# Patient Record
Sex: Male | Born: 1990 | Race: Black or African American | Hispanic: No | Marital: Single | Smoking: Current every day smoker
Health system: Southern US, Community
[De-identification: ages and names within clinical notes are randomized; demographics above are authoritative.]

---
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
## 2018-11-16 ENCOUNTER — Ambulatory Visit (HOSPITAL_COMMUNITY)
Admission: EM | Admit: 2018-11-16 | Discharge: 2018-11-16 | Disposition: A | Discharge status: Home / Self Care | Payer: 59 | Attending: Internal Medicine | Admitting: Internal Medicine

## 2018-11-16 ENCOUNTER — Encounter (HOSPITAL_COMMUNITY): Payer: Self-pay | Admitting: Emergency Medicine

## 2018-11-16 ENCOUNTER — Other Ambulatory Visit: Payer: Self-pay

## 2018-11-16 DIAGNOSIS — R030 Elevated blood-pressure reading, without diagnosis of hypertension: Secondary | ICD-10-CM

## 2018-11-16 DIAGNOSIS — R220 Localized swelling, mass and lump, head: Secondary | ICD-10-CM | POA: Diagnosis not present

## 2018-11-16 MED ORDER — AMLODIPINE BESYLATE 5 MG PO TABS: 5.0000 mg | ORAL_TABLET | Freq: Every day | ORAL | 1 refills | Status: DC

## 2018-11-16 MED ORDER — CLONIDINE HCL 0.1 MG PO TABS
0.1000 mg | ORAL_TABLET | Freq: Once | ORAL | Status: AC
  Administered 2018-11-16: 10:00:00 0.1 mg via ORAL

## 2018-11-16 MED ORDER — CLONIDINE HCL 0.1 MG PO TABS
ORAL_TABLET | ORAL | Status: AC
  Filled 2018-11-16: qty 1

## 2018-11-16 MED ORDER — PREDNISONE 10 MG PO TABS: 20.0000 mg | ORAL_TABLET | Freq: Every day | ORAL | 0 refills | Status: AC

## 2018-11-16 MED ORDER — DIPHENHYDRAMINE HCL 25 MG PO TABS: 25.0000 mg | ORAL_TABLET | Freq: Four times a day (QID) | ORAL | 0 refills | Status: AC | PRN

## 2018-11-16 NOTE — ED Triage Notes (Signed)
Pt presents to [facility] for assessment of swollen left lip since yesterday, states it got markedly more pronounced this morning.  Denies injury.

## 2018-11-18 NOTE — ED Provider Notes (Signed)
[facility]    CSN: [id number] Arrival date & time: 11/16/18  0935     History   Chief Complaint Chief Complaint  Patient presents with  . Oral Swelling    HPI Jeffrey Kirby is a 28 y.o. male no past medical history comes to urgent care with 1 day history of lower lip swelling.  Patient denies any trauma to the lip.  Swelling started insidiously and is gotten progressively worse.  He believes that he had a bug bite.  Lip is not painful.  No tongue swelling.  No redness over the lip.  No nausea or vomiting.  No difficulty breathing.   HPI  History reviewed. No pertinent past medical history.  There are no active problems to display for this patient.   History reviewed. No pertinent surgical history.     Home Medications    Prior to Admission medications   Medication Sig Start Date End Date Taking? Authorizing Provider  amLODipine (NORVASC) 5 MG tablet Take 1 tablet (5 mg total) by mouth daily. 11/16/18   Dreon Pineda, Britta MccreedyPhilip O, MD  diphenhydrAMINE (BENADRYL) 25 MG tablet Take 1 tablet (25 mg total) by mouth every 6 (six) hours as needed for up to 3 days for itching or allergies. 11/16/18 11/19/18  Merrilee JanskyLamptey, Cachet Mccutchen O, MD  predniSONE (DELTASONE) 10 MG tablet Take 2 tablets (20 mg total) by mouth daily for 3 days. 11/16/18 11/19/18  Merrilee JanskyLamptey, Daivon Rayos O, MD    Family History History reviewed. No pertinent family history.  Social History Social History   Tobacco Use  . Smoking status: Current Every Day Smoker    Types: Cigars  . Smokeless tobacco: Never Used  Substance Use Topics  . Alcohol use: Not Currently  . Drug use: Never     Allergies   Shrimp [shellfish allergy]   Review of Systems Review of Systems  HENT: Negative.  Negative for mouth sores, nosebleeds, postnasal drip, rhinorrhea and sore throat.   Respiratory: Negative.   Cardiovascular: Negative.   Gastrointestinal: Negative for abdominal distention, abdominal pain, diarrhea and nausea.   Genitourinary: Negative.   Musculoskeletal: Negative for arthralgias and gait problem.  Skin: Negative.   Neurological: Negative for dizziness, light-headedness and headaches.     Physical Exam Triage Vital Signs ED Triage Vitals  Enc Vitals Group     BP 11/16/18 1004 (!) 190/138     Pulse Rate 11/16/18 1004 74     Resp 11/16/18 1004 18     Temp 11/16/18 1004 98.3 F (36.8 C)     Temp Source 11/16/18 1004 Oral     SpO2 11/16/18 1004 99 %     Weight --      Height --      Head Circumference --      Peak Flow --      Pain Score 11/16/18 1008 5     Pain Loc --      Pain Edu? --      Excl. in GC? --    No data found.  Updated Vital Signs BP (!) 181/130 (BP Location: Right Arm)   Pulse 74   Temp 98.3 F (36.8 C) (Oral)   Resp 18   SpO2 99%   Visual Acuity Right Eye Distance:   Left Eye Distance:   Bilateral Distance:    Right Eye Near:   Left Eye Near:    Bilateral Near:     Physical Exam Constitutional:      Appearance: Normal appearance.  HENT:     Mouth/Throat:     Mouth: Mucous membranes are moist.     Comments: Lower lip swelling.  No erythema.  No discharge.  Tongue is not swollen.  Uvula is midline Eyes:     Conjunctiva/sclera: Conjunctivae normal.  Neck:     Musculoskeletal: Normal range of motion and neck supple.  Cardiovascular:     Rate and Rhythm: Normal rate and regular rhythm.     Pulses: Normal pulses.     Heart sounds: Normal heart sounds.  Pulmonary:     Effort: Pulmonary effort is normal.     Breath sounds: Normal breath sounds.  Musculoskeletal: Normal range of motion.  Skin:    General: Skin is warm.     Capillary Refill: Capillary refill takes less than 2 seconds.  Neurological:     Mental Status: He is alert.      UC Treatments / Results  Labs (all labs ordered are listed, but only abnormal results are displayed) Labs Reviewed - No data to display  EKG None  Radiology No results found.  Procedures Procedures  (including critical care time)  Medications Ordered in UC Medications  cloNIDine (CATAPRES) tablet 0.1 mg (0.1 mg Oral Given 11/16/18 1026)    Initial Impression / Assessment and Plan / UC Course  I have reviewed the triage vital signs and the nursing notes.  Pertinent labs & imaging results that were available during my care of the patient were reviewed by me and considered in my medical decision making (see chart for details).     1.  Lower lip swelling-probably allergic reaction Prednisone 20 mg orally daily for 3 days Benadryl 25 mg as needed for itching  2.  Elevated blood pressure with no diagnosis of hypertension: Clonidine 0.1 mg was given Amlodipine 5 mg orally daily  Final Clinical Impressions(s) / UC Diagnoses   Final diagnoses:  Lip swelling  Elevated BP without diagnosis of hypertension   Discharge Instructions   None    ED Prescriptions    Medication Sig Dispense Auth. Provider   diphenhydrAMINE (BENADRYL) 25 MG tablet Take 1 tablet (25 mg total) by mouth every 6 (six) hours as needed for up to 3 days for itching or allergies. 12 tablet Rakayla Ricklefs, Britta Mccreedy, MD   predniSONE (DELTASONE) 10 MG tablet Take 2 tablets (20 mg total) by mouth daily for 3 days. 6 tablet Billy Turvey, Britta Mccreedy, MD   amLODipine (NORVASC) 5 MG tablet Take 1 tablet (5 mg total) by mouth daily. 30 tablet Medha Pippen, Britta Mccreedy, MD     Controlled Substance Prescriptions [place] Controlled Substance Registry consulted? No   Merrilee Jansky, MD 11/18/18 [phone]

## 2022-04-25 ENCOUNTER — Emergency Department (HOSPITAL_COMMUNITY)
Admission: EM | Admit: 2022-04-25 | Discharge: 2022-04-26 | Disposition: A | Discharge status: Home / Self Care | Payer: Medicaid Other | Attending: Emergency Medicine | Admitting: Emergency Medicine

## 2022-04-25 ENCOUNTER — Encounter (HOSPITAL_COMMUNITY): Payer: Self-pay

## 2022-04-25 ENCOUNTER — Emergency Department (HOSPITAL_COMMUNITY): Payer: Medicaid Other

## 2022-04-25 ENCOUNTER — Other Ambulatory Visit: Payer: Self-pay

## 2022-04-25 DIAGNOSIS — F1210 Cannabis abuse, uncomplicated: Secondary | ICD-10-CM | POA: Insufficient documentation

## 2022-04-25 DIAGNOSIS — R778 Other specified abnormalities of plasma proteins: Secondary | ICD-10-CM | POA: Insufficient documentation

## 2022-04-25 DIAGNOSIS — I10 Essential (primary) hypertension: Secondary | ICD-10-CM | POA: Insufficient documentation

## 2022-04-25 DIAGNOSIS — R000 Tachycardia, unspecified: Secondary | ICD-10-CM | POA: Insufficient documentation

## 2022-04-25 DIAGNOSIS — Z79899 Other long term (current) drug therapy: Secondary | ICD-10-CM | POA: Insufficient documentation

## 2022-04-25 DIAGNOSIS — R55 Syncope and collapse: Secondary | ICD-10-CM

## 2022-04-25 DIAGNOSIS — F1410 Cocaine abuse, uncomplicated: Secondary | ICD-10-CM | POA: Insufficient documentation

## 2022-04-25 DIAGNOSIS — F17200 Nicotine dependence, unspecified, uncomplicated: Secondary | ICD-10-CM | POA: Insufficient documentation

## 2022-04-25 LAB — URINALYSIS, ROUTINE W REFLEX MICROSCOPIC
Bilirubin Urine: NEGATIVE
Glucose, UA: NEGATIVE mg/dL
Hgb urine dipstick: NEGATIVE
Ketones, ur: NEGATIVE mg/dL
Leukocytes,Ua: NEGATIVE
Nitrite: NEGATIVE
Protein, ur: NEGATIVE mg/dL
Specific Gravity, Urine: 1.004 — ABNORMAL LOW (ref 1.005–1.030)
pH: 5 (ref 5.0–8.0)

## 2022-04-25 LAB — CBC
HCT: 42.1 % (ref 39.0–52.0)
Hemoglobin: 14.4 g/dL (ref 13.0–17.0)
MCH: 30.3 pg (ref 26.0–34.0)
MCHC: 34.2 g/dL (ref 30.0–36.0)
MCV: 88.4 fL (ref 80.0–100.0)
Platelets: 233 10*3/uL (ref 150–400)
RBC: 4.76 MIL/uL (ref 4.22–5.81)
RDW: 11.9 % (ref 11.5–15.5)
WBC: 9 10*3/uL (ref 4.0–10.5)
nRBC: 0 % (ref 0.0–0.2)

## 2022-04-25 LAB — BASIC METABOLIC PANEL
Anion gap: 10 (ref 5–15)
BUN: 19 mg/dL (ref 6–20)
CO2: 24 mmol/L (ref 22–32)
Calcium: 9.3 mg/dL (ref 8.9–10.3)
Chloride: 103 mmol/L (ref 98–111)
Creatinine, Ser: 1.6 mg/dL — ABNORMAL HIGH (ref 0.61–1.24)
GFR, Estimated: 59 mL/min — ABNORMAL LOW (ref >60)
Glucose, Bld: 111 mg/dL — ABNORMAL HIGH (ref 70–99)
Potassium: 3.9 mmol/L (ref 3.5–5.1)
Sodium: 137 mmol/L (ref 135–145)

## 2022-04-25 LAB — TROPONIN I (HIGH SENSITIVITY): Troponin I (High Sensitivity): 35 ng/L — ABNORMAL HIGH (ref <18)

## 2022-04-25 LAB — CBG MONITORING, ED: Glucose-Capillary: 89 mg/dL (ref 70–99)

## 2022-04-25 NOTE — ED Triage Notes (Signed)
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 Had a near syncopal episode. Feels back at baseline.

## 2022-04-25 NOTE — ED Provider Triage Note (Signed)
Emergency Medicine Provider Triage Evaluation Note  Jeffrey Kirby , a 31 y.o. male  was evaluated in triage.  Patient complains of near syncope.  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 and became hot and near syncopized.  Feels normal now  Review of Systems  Positive:  Negative:   Physical Exam  BP (!) 221/121 (BP Location: Left Arm)   Pulse (!) 120   Temp 98.9 F (37.2 C)   Resp 16   Ht 6\' 2"  (1.88 m)   Wt 104.3 kg   SpO2 99%   BMI 29.53 kg/m  Gen:   Awake, no distress   Resp:  Normal effort  MSK:   Moves extremities without difficulty  Other:  Well-appearing, moving all extremities ambulatory.  Not diaphoretic.  Medical Decision Making  Medically screening exam initiated at 8:23 PM.  Appropriate orders placed.  Sire A Petta was informed that the remainder of the evaluation will be completed by another provider, this initial triage assessment does not replace that evaluation, and the importance of remaining in the ED until their evaluation is complete.   HTN in triage, asx from HTN emergency standpoint    Rhae Hammock, PA-C 04/25/22 2032

## 2022-04-26 LAB — TROPONIN I (HIGH SENSITIVITY)
Troponin I (High Sensitivity): 59 ng/L — ABNORMAL HIGH (ref <18)
Troponin I (High Sensitivity): 60 ng/L — ABNORMAL HIGH (ref <18)

## 2022-04-26 LAB — RAPID URINE DRUG SCREEN, HOSP PERFORMED
Amphetamines: NOT DETECTED
Barbiturates: NOT DETECTED
Benzodiazepines: NOT DETECTED
Cocaine: POSITIVE — AB
Opiates: NOT DETECTED
Tetrahydrocannabinol: POSITIVE — AB

## 2022-04-26 LAB — D-DIMER, QUANTITATIVE: D-Dimer, Quant: <0.27 ug/mL-FEU (ref 0.00–0.50)

## 2022-04-26 MED ORDER — HYDRALAZINE HCL 20 MG/ML IJ SOLN
10.0000 mg | Freq: Once | INTRAMUSCULAR | Status: AC
Start: 2022-04-26 — End: 2022-04-26
  Administered 2022-04-26: 10 mg via INTRAVENOUS
  Filled 2022-04-26: qty 1

## 2022-04-26 MED ORDER — AMLODIPINE BESYLATE 5 MG PO TABS: 5.0000 mg | ORAL_TABLET | Freq: Every day | ORAL | 2 refills | Status: AC

## 2022-04-26 MED ORDER — CLONIDINE HCL 0.1 MG PO TABS
0.1000 mg | ORAL_TABLET | Freq: Once | ORAL | Status: AC
  Administered 2022-04-26: 0.1 mg via ORAL
  Filled 2022-04-26: qty 1

## 2022-04-26 MED ORDER — AMLODIPINE BESYLATE 5 MG PO TABS
5.0000 mg | ORAL_TABLET | Freq: Once | ORAL | Status: AC
  Administered 2022-04-26: 5 mg via ORAL
  Filled 2022-04-26: qty 1

## 2022-04-26 MED ORDER — AMLODIPINE BESYLATE 5 MG PO TABS: 5.0000 mg | ORAL_TABLET | Freq: Once | ORAL | Status: DC

## 2022-04-26 NOTE — ED Notes (Signed)
RN reviewed discharge instructions with pt. Pt verbalized understanding and had no further questions. VSS upon discharge.  

## 2022-04-26 NOTE — ED Provider Notes (Signed)
[facility] Provider Note   CSN: [id number] Arrival date & time: 04/25/22  1933     History  Chief Complaint  Patient presents with   Near Syncope    Jeffrey Kirby is a 31 y.o. male.  The history is provided by the patient.  Patient presents for an episode of lightheadedness at work. Patient reports he works at [employer] He reports he felt overheated and for over 30 minutes felt lightheaded.  No syncope.  Denies any slurred speech or focal weakness.  No numbness.  No chest or back pain.  No shortness of breath.  He feels back to baseline at this time.  He reports a history of untreated hypertension.  He does not have a primary care provider.  He reports he smokes Black and milds.  He denies any substance abuse.       Home Medications Prior to Admission medications   Medication Sig Start Date End Date Taking? Authorizing Provider  amLODipine (NORVASC) 5 MG tablet Take 1 tablet (5 mg total) by mouth daily. Patient not taking: Reported on 04/25/2022 11/16/18   Chase Picket, MD  diphenhydrAMINE (BENADRYL) 25 MG tablet Take 1 tablet (25 mg total) by mouth every 6 (six) hours as needed for up to 3 days for itching or allergies. Patient not taking: Reported on 04/25/2022 11/16/18 11/19/18  Chase Picket, MD      Allergies    Shrimp [shellfish allergy]    Review of Systems   Review of Systems  Constitutional:  Negative for fever.  Respiratory:  Negative for shortness of breath.   Cardiovascular:  Negative for chest pain.  Neurological:  Positive for light-headedness. Negative for syncope, weakness and numbness.    Physical Exam Updated Vital Signs BP (!) 198/128 (BP Location: Left Arm)   Pulse 70   Temp 98.2 F (36.8 C) (Oral)   Resp 18   Ht 1.88 m (6\' 2" )   Wt 104.3 kg   SpO2 100%   BMI 29.53 kg/m  Physical Exam CONSTITUTIONAL: Well developed/well nourished HEAD: Normocephalic/atraumatic EYES: EOMI/PERRL, no  nystagmus ENMT: Mucous membranes moist NECK: supple no meningeal signs CV: S1/S2 noted, no murmurs/rubs/gallops noted LUNGS: Lungs are clear to auscultation bilaterally, no apparent distress ABDOMEN: soft, nontender NEURO:Awake/alert, face symmetric, no arm or leg drift is noted Equal 5/5 strength with shoulder abduction, elbow flex/extension, wrist flex/extension in upper extremities and equal hand grips bilaterally Equal 5/5 strength with hip flexion,knee flex/extension, foot dorsi/plantar flexion Gait normal without ataxia Sensation to light touch intact in all extremities EXTREMITIES: pulses normalx4, full ROM SKIN: warm, color normal PSYCH: Unusual affect noted  ED Results / Procedures / Treatments   Labs (all labs ordered are listed, but only abnormal results are displayed) Labs Reviewed  BASIC METABOLIC PANEL - Abnormal; Notable for the following components:      Result Value   Glucose, Bld 111 (*)    Creatinine, Ser 1.60 (*)    GFR, Estimated 59 (*)    All other components within normal limits  URINALYSIS, ROUTINE W REFLEX MICROSCOPIC - Abnormal; Notable for the following components:   Color, Urine STRAW (*)    Specific Gravity, Urine 1.004 (*)    All other components within normal limits  RAPID URINE DRUG SCREEN, HOSP PERFORMED - Abnormal; Notable for the following components:   Cocaine POSITIVE (*)    Tetrahydrocannabinol POSITIVE (*)    All other components within normal limits  TROPONIN I (HIGH SENSITIVITY) -  Abnormal; Notable for the following components:   Troponin I (High Sensitivity) 35 (*)    All other components within normal limits  TROPONIN I (HIGH SENSITIVITY) - Abnormal; Notable for the following components:   Troponin I (High Sensitivity) 59 (*)    All other components within normal limits  TROPONIN I (HIGH SENSITIVITY) - Abnormal; Notable for the following components:   Troponin I (High Sensitivity) 60 (*)    All other components within normal limits   CBC  D-DIMER, QUANTITATIVE  CBG MONITORING, ED    EKG EKG Interpretation  Date/Time:  Monday April 25 2022 20:23:11 EDT Ventricular Rate:  114 PR Interval:  160 QRS Duration: 88 QT Interval:  334 QTC Calculation: 460 R Axis:   67 Text Interpretation: Sinus tachycardia Left ventricular hypertrophy with repolarization abnormality ( Sokolow-Lyon ) Cannot rule out Inferior infarct , age undetermined Abnormal ECG No previous ECGs available Confirmed by Zadie Rhine ([id number]) on 04/26/2022 3:52:33 AM    EKG Interpretation  Date/Time:  Tuesday April 26 2022 04:31:01 EDT Ventricular Rate:  77 PR Interval:  182 QRS Duration: 95 QT Interval:  421 QTC Calculation: 477 R Axis:   78 Text Interpretation: Sinus rhythm Probable LVH with secondary repol abnrm Borderline prolonged QT interval Interpretation limited secondary to artifact Confirmed by Zadie Rhine ([id number]) on 04/26/2022 4:40:18 AM        Radiology DG Chest 2 View  Result Date: 04/25/2022 CLINICAL DATA:  Near syncope. EXAM: CHEST - 2 VIEW COMPARISON:  None Available. FINDINGS: The heart size and mediastinal contours are within normal limits. Both lungs are clear. The visualized skeletal structures are unremarkable. IMPRESSION: No active cardiopulmonary disease. Electronically Signed   By: Elgie Collard M.D.   On: 04/25/2022 22:19    Procedures Procedures    Medications Ordered in ED Medications  amLODipine (NORVASC) tablet 5 mg (5 mg Oral Not Given 04/26/22 0436)  hydrALAZINE (APRESOLINE) injection 10 mg (10 mg Intravenous Given 04/26/22 0434)    ED Course/ Medical Decision Making/ A&P Clinical Course as of 04/26/22 0635  Tue Apr 26, 2022  0438 Creatinine(!): 1.60 Renal insufficiency [DW]  [phone] Patient presents after he reportedly felt unwell at work.  He reports he works in [employer] and felt like he was overheated.  He denied any syncope.  Denies any focal weakness to suggest stroke.  He adamantly  denies chest or back pain.  However patient does appear to be an unreliable historian and is evasive during questioning.  He does admit to not taking any medications for his blood pressure nor following up with a PCP. [DW]  [phone] Patient has evidence of uncontrolled hypertension.  He does have evidence of renal insufficiency likely as a result of this.  His troponins are also elevated which could be result of uncontrolled hypertension [DW]  0450 However given patient's history and his tachycardia, will do further screening including D-dimer.  If this is negative, we will likely focus on BP control.  Low suspicion for acute coronary syndrome or aortic dissection at this time [DW]  0514 D-dimer is negative, therefore PE is less likely.  Drug screen reveals evidence of cocaine use.  This could likely contribute to his elevated blood pressure.  We will now transition to oral medications. [DW]  2585 Patient is awake alert in no acute distress.  He is smiling and interactive [DW]  0620 He has been given oral medications and doing well overall.  He admits that he never took the medications the  first time.  We a long discussion about the long-term effects of uncontrolled hypertension.  I discussed that he is already showing signs of renal insufficiency likely due to his poor blood pressure control.  I also suspect that his mild elevation in troponin is due to chronic hypertension.  His EKG also reveals signs of longstanding hypertension.  I stressed the importance of regular follow-up and taking his blood pressure medicines.  Patient reports he feels committed to start taking medications.  He has been given medicines for the next month with 2 refills.  He has been referred to PCP as well as cardiology. [DW]  [phone] Given the patient is stable, no acute distress, no active symptoms at this time, will not treat for hypertensive emergency.  We will start oral medications to allow for BP to improve over time. [DW]     Clinical Course User Index [DW] Ripley Fraise, MD                           Medical Decision Making Amount and/or Complexity of Data Reviewed Labs: ordered. Decision-making details documented in ED Course. ECG/medicine tests: ordered.  Risk Prescription drug management.   This patient presents to the ED for concern of weakness and lightheadedness, this involves an extensive number of treatment options, and is a complaint that carries with it a high risk of complications and morbidity.  The differential diagnosis includes but is not limited to CVA, intracranial hemorrhage, acute coronary syndrome, renal failure, urinary tract infection, electrolyte disturbance, pneumonia, aortic dissection, pulmonary embolism    Comorbidities that complicate the patient evaluation: Patient's presentation is complicated by their history of uncontrolled hypertension  Social Determinants of Health: Patient's lack of prescription access and impaired access to primary care  increases the complexity of managing their presentation  Additional history obtained: Records reviewed  urgent care notes reviewed  Lab Tests: I Ordered, and personally interpreted labs.  The pertinent results include: Renal insufficiency and elevated troponin  Imaging Studies ordered: I ordered imaging studies including X-ray chest   I independently visualized and interpreted imaging which showed no acute findings I agree with the radiologist interpretation  Cardiac Monitoring: The patient was maintained on a cardiac monitor.  I personally viewed and interpreted the cardiac monitor which showed an underlying rhythm of:  sinus rhythm  Medicines ordered and prescription drug management: I ordered medication including hydralazine for hypertension Reevaluation of the patient after these medicines showed that the patient    stayed the same  I considered the admission and further evaluation, the patient is improved and appears at  baseline.  Critical Interventions:  Initiation of BP meds  Reevaluation: After the interventions noted above, I reevaluated the patient and found that they have :improved  Complexity of problems addressed: Patient's presentation is most consistent with  acute presentation with potential threat to life or bodily function  Disposition: After consideration of the diagnostic results and the patient's response to treatment,  I feel that the patent would benefit from discharge   .           Final Clinical Impression(s) / ED Diagnoses Final diagnoses:  Near syncope  Uncontrolled hypertension    Rx / DC Orders ED Discharge Orders          Ordered    Ambulatory referral to Cardiology       Comments: If you have not heard from the Cardiology office within the next 72 hours please call  [phone].   04/26/22 0534    amLODipine (NORVASC) 5 MG tablet  Daily        04/26/22 0550              Ripley Fraise, MD 04/26/22 [phone]

## 2022-04-26 NOTE — ED Notes (Signed)
Brought back to triage for vital signs, reassessment by RN and provider.
